# Patient Record
Sex: Male | Born: 1990 | Race: White | Hispanic: No | Marital: Single | Smoking: Current every day smoker
Health system: Southern US, Community
[De-identification: ages and names within clinical notes are randomized; demographics above are authoritative.]

---
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
## 2013-06-04 ENCOUNTER — Encounter: Payer: Self-pay | Admitting: Family Medicine

## 2013-06-04 ENCOUNTER — Ambulatory Visit (INDEPENDENT_AMBULATORY_CARE_PROVIDER_SITE_OTHER): Payer: Managed Care, Other (non HMO) | Admitting: Family Medicine

## 2013-06-04 VITALS — BP 112/66 | Temp 97.2°F | Ht 70.5 in | Wt 137.6 lb

## 2013-06-04 DIAGNOSIS — Z0000 Encounter for general adult medical examination without abnormal findings: Secondary | ICD-10-CM | POA: Insufficient documentation

## 2013-06-04 DIAGNOSIS — Z7251 High risk heterosexual behavior: Secondary | ICD-10-CM | POA: Insufficient documentation

## 2013-06-04 LAB — STD PANEL
HIV: NONREACTIVE
Hepatitis B Surface Ag: NEGATIVE

## 2013-06-04 NOTE — Progress Notes (Signed)
  Subjective:    Patient ID: Connor Shaffer, male    DOB: 02-Oct-1990, 22 y.o.   MRN: [id number]  HPI Comments: Connor Shaffer is a 22 y.o WM here for wellness exam  He is a new patient to me but has been here in the past.  He is here to get STD check. His first sexual intercourse was at the age of 11 and since then, he says he's had 13 partners. Currently, he says he's had 3 different partners in the last year. He isn't in a monogamous relationship right now. He says he has no symptoms such as penile discharge, lesions, or dysuria. He hasn't been exposed to anyone with known STD's.   PMH: No Medications: none  Surgeries: Appendectomy  Social:does have alcohol use on the weekends. He says it consists of 'bootleggers' drinks. He may have 3-4 glasses of alcohol in a given weekend. He does have some tobacco use but went from smoking 2 packs a day to 5 cigarettes a day. No marijuana use recently but use to during high school He denies every doing any drug use or prescription pills. He works in [place], [place]. 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 He would like to get back in motor racing.  Allergies: NKDA  Family HX: lives with Connor Shaffer and Connor Shaffer in [place], [place]. He has an Connor Shaffer who moved out but still lives in the area. They have a good relationship and stays in touch. No medical problems in parents. His parents and him have a good relationship.    Review of Systems  Constitutional: Negative for activity change, appetite change, fatigue and unexpected weight change.  Gastrointestinal: Negative for abdominal pain.  Endocrine: Negative for polydipsia and polyuria.  Genitourinary: Negative for dysuria, frequency, discharge, penile swelling, scrotal swelling, enuresis, genital sores, penile pain and testicular pain.  Psychiatric/Behavioral: Negative for confusion. The patient is not nervous/anxious.        Objective:   Physical Exam  Nursing note and  vitals reviewed. Constitutional: He appears well-developed and well-nourished.  HENT:  Head: Normocephalic and atraumatic.  Right Ear: External ear normal.  Left Ear: External ear normal.  Nose: Nose normal.  Mouth/Throat: Oropharynx is clear and moist.  Eyes: Pupils are equal, round, and reactive to light.  Neck: Normal range of motion. Neck supple.  Cardiovascular: Normal rate, regular rhythm and normal heart sounds.   Pulmonary/Chest: Effort normal and breath sounds normal.  Abdominal: Soft. Bowel sounds are normal.  Genitourinary:  deferred  Neurological: He is alert.  Skin: Skin is warm and dry.  Psychiatric: He has a normal mood and affect. His behavior is normal. Thought content normal.      Assessment & Plan:  Shahid was seen today for well child.  Diagnoses and associated orders for this visit:  Physical exam, annual - STD Panel (HBSAG,HIV,RPR); Future - GC/chlamydia probe amp, urine; Future - STD Panel (HBSAG,HIV,RPR) - GC/chlamydia probe amp, urine  High risk sexual behavior - STD Panel (HBSAG,HIV,RPR); Future - GC/chlamydia probe amp, urine; Future - STD Panel (HBSAG,HIV,RPR) - GC/chlamydia probe amp, urine   No risk factors or family hx of CAD, DM, HLD so will defer these labs. -due to risky sexual behavior and requested by patient, getting STD panel and GC/Chlamydia. To follow up pending results.  Have counseled on risks of sexual behavior and advised for safe sex practices.

## 2013-06-05 LAB — GC/CHLAMYDIA PROBE AMP, URINE
Chlamydia, Swab/Urine, PCR: NEGATIVE
GC Probe Amp, Urine: NEGATIVE

## 2013-09-08 ENCOUNTER — Encounter: Payer: Self-pay | Admitting: Family Medicine

## 2013-09-08 ENCOUNTER — Ambulatory Visit (INDEPENDENT_AMBULATORY_CARE_PROVIDER_SITE_OTHER): Payer: Managed Care, Other (non HMO) | Admitting: Family Medicine

## 2013-09-08 VITALS — BP 92/54 | HR 82 | Temp 98.6°F | Resp 20 | Ht 70.0 in | Wt 138.4 lb

## 2013-09-08 DIAGNOSIS — Z7251 High risk heterosexual behavior: Secondary | ICD-10-CM

## 2013-09-08 DIAGNOSIS — B354 Tinea corporis: Secondary | ICD-10-CM

## 2013-09-08 MED ORDER — CLOTRIMAZOLE 1 % EX CREA: 1.0000 "application " | TOPICAL_CREAM | Freq: Two times a day (BID) | CUTANEOUS | Status: DC

## 2013-09-08 NOTE — Patient Instructions (Signed)
Body Ringworm °Ringworm (tinea corporis) is a fungal infection of the skin on the body. This infection is not caused by worms, but is actually caused by a fungus. Fungus normally lives on the top of your skin and can be useful. However, in the case of ringworms, the fungus grows out of control and causes a skin infection. It can involve any area of skin on the body and can spread easily from one person to another (contagious). Ringworm is a common problem for children, but it can affect adults as well. Ringworm is also often found in athletes, especially wrestlers who share equipment and mats.  °CAUSES  °Ringworm of the body is caused by a fungus called dermatophyte. It can spread by: °· Touching other people who are infected. °· Touching infected pets. °· Touching or sharing objects that have been in contact with the infected person or pet (hats, combs, towels, clothing, sports equipment). °SYMPTOMS  °· Itchy, raised red spots and bumps on the skin. °· Ring-shaped rash. °· Redness near the border of the rash with a clear center. °· Dry and scaly skin on or around the rash. °Not every person develops a ring-shaped rash. Some develop only the red, scaly patches. °DIAGNOSIS  °Most often, ringworm can be diagnosed by performing a skin exam. Your caregiver may choose to take a skin scraping from the affected area. The sample will be examined under the microscope to see if the fungus is present.  °TREATMENT  °Body ringworm may be treated with a topical antifungal cream or ointment. Sometimes, an antifungal shampoo that can be used on your body is prescribed. You may be prescribed antifungal medicines to take by mouth if your ringworm is severe, keeps coming back, or lasts a long time.  °HOME CARE INSTRUCTIONS  °· Only take over-the-counter or prescription medicines as directed by your caregiver. °· Wash the infected area and dry it completely before applying your cream or ointment. °· When using antifungal shampoo to  treat the ringworm, leave the shampoo on the body for 3 5 minutes before rinsing.    °· Wear loose clothing to stop clothes from rubbing and irritating the rash. °· Wash or change your bed sheets every night while you have the rash. °· Have your pet treated by your veterinarian if it has the same infection. °To prevent ringworm:  °· Practice good hygiene. °· Wear sandals or shoes in public places and showers. °· Do not share personal items with others. °· Avoid touching red patches of skin on other people. °· Avoid touching pets that have bald spots or wash your hands after doing so. °SEEK MEDICAL CARE IF:  °· Your rash continues to spread after 7 days of treatment. °· Your rash is not gone in 4 weeks. °· The area around your rash becomes red, warm, tender, and swollen. °Document Released: 08/18/2000 Document Revised: 05/15/2012 Document Reviewed: 03/04/2012 °ExitCare® Patient Information ©2014 [employer]. ° °

## 2013-09-09 ENCOUNTER — Telehealth: Payer: Self-pay | Admitting: *Deleted

## 2013-09-09 LAB — GC/CHLAMYDIA PROBE AMP, URINE
Chlamydia, Swab/Urine, PCR: NEGATIVE
GC Probe Amp, Urine: NEGATIVE

## 2013-09-09 NOTE — Telephone Encounter (Signed)
Pt returned nurse call. Pt notified of results. Appreciative and understanding.

## 2013-09-09 NOTE — Telephone Encounter (Signed)
Attempted call to give pt results, no answer, message left for callback.

## 2013-09-09 NOTE — Telephone Encounter (Signed)
Message copied by [facility]CD[facility]EL, Bonnell PublicAPRIL J on Tue Sep 09, 2013  3:27 PM ------      Message from: Acey LavWOOD, ALLISON L      Created: Tue Sep 09, 2013  9:15 AM       Please let pt know gc chlam neg. Thanks AW ------

## 2013-09-09 NOTE — Progress Notes (Signed)
See telephone encounter.

## 2013-09-09 NOTE — Telephone Encounter (Signed)
Message copied by [facility]ANIEL, Bonnell PublicAPRIL J on Tue Sep 09, 2013  3:46 PM ------      Message from: Acey LavWOOD, ALLISON L      Created: Tue Sep 09, 2013  9:15 AM       Please let pt know gc chlam neg. Thanks AW ------

## 2013-09-17 ENCOUNTER — Ambulatory Visit: Payer: Managed Care, Other (non HMO) | Admitting: Family Medicine

## 2013-09-22 ENCOUNTER — Encounter: Payer: Self-pay | Admitting: Family Medicine

## 2013-09-22 ENCOUNTER — Ambulatory Visit (INDEPENDENT_AMBULATORY_CARE_PROVIDER_SITE_OTHER): Payer: Managed Care, Other (non HMO) | Admitting: Family Medicine

## 2013-09-22 VITALS — BP 122/72 | HR 82 | Temp 98.4°F | Resp 18 | Ht 70.0 in | Wt 137.0 lb

## 2013-09-22 DIAGNOSIS — Z7251 High risk heterosexual behavior: Secondary | ICD-10-CM

## 2013-09-22 MED ORDER — CLOTRIMAZOLE 1 % EX CREA: 1.0000 "application " | TOPICAL_CREAM | Freq: Two times a day (BID) | CUTANEOUS | Status: AC

## 2013-09-22 NOTE — Progress Notes (Signed)
   Subjective:    Patient ID: Connor Shaffer, male    DOB: 01/05/1991, 23 y.o.   MRN: [id number]  HPI Pt here with scaly rash on right hip, worsening over past few weeks. No systemic sx. He has had some high risk sexual behavior and is worried this is an std.    Review of Systems A 12 point review of systems is negative except as per hpi.       Objective:   Physical Exam Nursing note and vitals reviewed. Constitutional: He is oriented to person, place, and time. He  appears well-developed and well-nourished.  HENT:  Right Ear: External ear normal.  Left Ear: External ear normal.  Nose: Nose normal.  Mouth/Throat: Oropharynx is clear and moist. No oropharyngeal exudate.  Eyes: Conjunctivae are normal. Pupils are equal, round, and reactive to light.  Neck: Normal range of motion. Neck supple. No thyromegaly present.  Cardiovascular: Normal rate, regular rhythm and normal heart sounds.   Pulmonary/Chest: Effort normal and breath sounds normal.  Abdominal: Soft. Bowel sounds are normal.  no distension. There is no tenderness. There is no rebound.  Lymphadenopathy:    He has no cervical adenopathy.  Neurological: He is alert and oriented to person, place, and time. He has normal reflexes.  Skin: Skin is warm and dry.right hip and right and lower abd, scaly rash consist with tinea. Psychiatric: He has a normal mood and affect. His behavior is normal.        Assessment & Plan:  Connor Shaffer was seen today for rash.  Diagnoses and associated orders for this visit:  Tinea corporis - clotrimazole (LOTRIMIN) 1 % cream; Apply 1 application topically 2 (two) times daily. - GC/chlamydia probe amp, urine - STD Panel (HBSAG,HIV,RPR); Future

## 2013-09-22 NOTE — Patient Instructions (Signed)
Sexually Transmitted Disease A sexually transmitted disease (STD) is a disease or infection that may be passed (transmitted) from person to person, usually during sexual activity. This may happen by way of saliva, semen, blood, vaginal mucus, or urine. Common STDs include:   Gonorrhea.   Chlamydia.   Syphilis.   HIV and AIDS.   Genital herpes.   Hepatitis B and C.   Trichomonas.   Human papillomavirus (HPV).   Pubic lice.   Scabies.  Mites.  Bacterial vaginosis. WHAT ARE CAUSES OF STDs? An STD may be caused by bacteria, a virus, or parasites. STDs are often transmitted during sexual activity if one person is infected. However, they may also be transmitted through nonsexual means. STDs may be transmitted after:   Sexual intercourse with an infected person.   Sharing sex toys with an infected person.   Sharing needles with an infected person or using unclean piercing or tattoo needles.  Having intimate contact with the genitals, mouth, or rectal areas of an infected person.   Exposure to infected fluids during birth. WHAT ARE THE SIGNS AND SYMPTOMS OF STDs? Different STDs have different symptoms. Some people may not have any symptoms. If symptoms are present, they may include:   Painful or bloody urination.   Pain in the pelvis, abdomen, vagina, anus, throat, or eyes.   Skin rash, itching, irritation, growths, sores (lesions), ulcerations, or warts in the genital or anal area.  Abnormal vaginal discharge with or without bad odor.   Penile discharge in men.   Fever.   Pain or bleeding during sexual intercourse.   Swollen glands in the groin area.   Yellow skin and eyes (jaundice). This is seen with hepatitis.   Swollen testicles.  Infertility.  Sores and blisters in the mouth. HOW ARE STDs DIAGNOSED? To make a diagnosis, your health care provider may:   Take a medical history.   Perform a physical exam.   Take a sample of any  discharge for examination.  Swab the throat, cervix, opening to the penis, rectum, or vagina for testing.  Test a sample of your first morning urine.   Perform blood tests.   Perform a Pap smear, if this applies.   Perform a colposcopy.   Perform a laparoscopy.  HOW ARE STDs TREATED? Treatment depends on the STD. Some STDs may be treated but not cured.   Chlamydia, gonorrhea, trichomonas, and syphilis can be cured with antibiotics.   Genital herpes, hepatitis, and HIV can be treated, but not cured, with prescribed medicines. The medicines lessen symptoms.   Genital warts from HPV can be treated with medicine or by freezing, burning (electrocautery), or surgery. Warts may come back.   HPV cannot be cured with medicine or surgery. However, abnormal areas may be removed from the cervix, vagina, or vulva.   If your diagnosis is confirmed, your recent sexual partners need treatment. This is true even if they are symptom-free or have a negative culture or evaluation. They should not have sex until their health care providers say it is OK. HOW CAN I REDUCE MY RISK OF GETTING AN STD?  Use latex condoms, dental dams, and water-soluble lubricants during sexual activity. Do not use petroleum jelly or oils.  Get vaccinated for HPV and hepatitis. If you have not received these vaccines in the past, talk to your health care provider about whether one or both might be right for you.   Avoid risky sex practices that can break the skin.  WHAT SHOULD   I DO IF I THINK I HAVE AN STD?  See your health care provider.   Inform all sexual partners. They should be tested and treated for any STDs.  Do not have sex until your health care provider says it is OK. WHEN SHOULD I GET HELP? Seek immediate medical care if:  You develop severe abdominal pain.  You are a man and notice swelling or pain in the testicles.  You are a woman and notice swelling or pain in your vagina. Document  Released: 11/11/2002 Document Revised: 06/11/2013 Document Reviewed: 03/11/2013 ExitCare Patient Information 2014 [employer].  

## 2013-09-22 NOTE — Progress Notes (Signed)
   Subjective:    Patient ID: Connor Shaffer, male    DOB: 12/20/1990, 23 y.o.   MRN: [id number]  HPI Here today to followup on his tinea corporis. He's been using clotrimazole and says that the rash seems to be improving. Been doing this since January 5.  He did have some high-risk sexual behavior last year as recently as new years. We discussed timing of STD testing last time he was in. Today he says he like to do the first round of testing and he realizes he needs to get checked again in a few months. Is coming Chlamydia come back negative last time and he denies any lesions or discharge.   Review of Systems A 12 point review of systems is negative except as per hpi.      Objective:   Physical Exam Nursing note and vitals reviewed. Constitutional: He is oriented to person, place, and time. He  appears well-developed and well-nourished.  Neck: Normal range of motion. Neck supple. No thyromegaly present.  Cardiovascular: Normal rate, regular rhythm and normal heart sounds.   Pulmonary/Chest: Effort normal and breath sounds normal.  Abdominal: Soft. Bowel sounds are normal.  no distension. There is no tenderness. There is no rebound.  Lymphadenopathy:    He has no cervical adenopathy.  Neurological: He is alert and oriented to person, place, and time. He has normal reflexes.  Skin: Skin is warm and dry.He has no concerning moles or skin lesions. Tinea seems to be improving.  Psychiatric: He has a normal mood and affect. His behavior is normal.        Assessment & Plan:  Connor Shaffer was seen today for follow-up.  Diagnoses and associated orders for this visit:  Problems related to high-risk sexual behavior - STD Panel (HBSAG,HIV,RPR)    Also refilled the clotrimazole. If the rash doesn't go away within the next few weeks he'll it me know. Lab orders are to get for April for him to get rechecked for STDs

## 2013-09-23 ENCOUNTER — Telehealth: Payer: Self-pay | Admitting: *Deleted

## 2013-09-23 LAB — STD PANEL
HIV: NONREACTIVE
Hepatitis B Surface Ag: NEGATIVE

## 2013-09-23 NOTE — Telephone Encounter (Signed)
Message copied by Caplan Berkeley LLPMCDANIEL, Bonnell PublicAPRIL J on Tue Sep 23, 2013  1:20 PM ------      Message from: Acey LavWOOD, ALLISON L      Created: Tue Sep 23, 2013  8:47 AM       Please let pt know std panel neg. Thanks aw ------

## 2013-09-23 NOTE — Progress Notes (Signed)
See telephone encounter.

## 2013-09-23 NOTE — Telephone Encounter (Signed)
Spoke with pt, he requested that results from both STD panel and urine results be mailed to his home. He was appreciative and understanding.
